# Patient Record
Sex: Male | Born: 1960
Health system: Southern US, Community
[De-identification: ages and names within clinical notes are randomized; demographics above are authoritative.]

## PROBLEM LIST (undated history)

## (undated) DIAGNOSIS — I10 Essential (primary) hypertension: Secondary | ICD-10-CM

## (undated) HISTORY — PX: NO PAST SURGERIES: SHX2092

---

## 2006-07-17 ENCOUNTER — Emergency Department: Payer: Self-pay | Admitting: Unknown Physician Specialty

## 2009-04-13 ENCOUNTER — Emergency Department: Payer: Self-pay | Admitting: Emergency Medicine

## 2016-04-09 ENCOUNTER — Encounter: Payer: Self-pay | Admitting: Emergency Medicine

## 2016-04-09 ENCOUNTER — Ambulatory Visit
Admission: EM | Admit: 2016-04-09 | Discharge: 2016-04-09 | Disposition: A | Discharge status: Home / Self Care | Payer: 59 | Attending: Internal Medicine | Admitting: Internal Medicine

## 2016-04-09 DIAGNOSIS — W57XXXA Bitten or stung by nonvenomous insect and other nonvenomous arthropods, initial encounter: Secondary | ICD-10-CM

## 2016-04-09 DIAGNOSIS — T148 Other injury of unspecified body region: Secondary | ICD-10-CM | POA: Diagnosis not present

## 2016-04-09 HISTORY — DX: Essential (primary) hypertension: I10

## 2016-04-09 MED ORDER — MUPIROCIN 2 % EX OINT: 1.0000 "application " | TOPICAL_OINTMENT | Freq: Three times a day (TID) | CUTANEOUS | Status: DC

## 2016-04-09 NOTE — ED Notes (Signed)
Patient reports having tick bites about a month ago.  Patient reports red knot on his right thigh that has not gone away.  Patient denies any joint pain, rash, or HAs.  Patient denies fevers.

## 2016-04-09 NOTE — Discharge Instructions (Signed)
Current tick bites are reported to be about a month old--with no rash, fever or increased joint discomfort noted. The area of inflammation is very small and appears to be resolving . I recommend using a Benadryl Itch Stik or other BugWand available OTC at the pharmacy. Using Cetirizine 10 mg (Zyrtec) 1 twice daily for 3 days may be helpful as well, though the acute stage appears to be over. 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 Please return to your PCP or to see [place] if you experience additional difficulties in the future  Antibiotic ointment has been ordered for your use in the future for outside injuries with skin infection  Tick Bite Information Ticks are insects that attach themselves to the skin and draw blood for food. There are various types of ticks. Common types include wood ticks and deer ticks. Most ticks live in shrubs and grassy areas. Ticks can climb onto your body when you make contact with leaves or grass where the tick is waiting. The most common places on the body for ticks to attach themselves are the scalp, neck, armpits, waist, and groin. Most tick bites are harmless, but sometimes ticks carry germs that cause diseases. These germs can be spread to a person during the tick's feeding process. The chance of a disease spreading through a tick bite depends on:   The type of tick.  Time of year.   How long the tick is attached.   Geographic location.  HOW CAN YOU PREVENT TICK BITES? Take these steps to help prevent tick bites when you are outdoors:  Wear protective clothing. Long sleeves and long pants are best.   Wear white clothes so you can see ticks more easily.  Tuck your pant legs into your socks.   If walking on a trail, stay in the middle of the trail to avoid brushing against bushes.  Avoid  walking through areas with long grass.  Put insect repellent on all exposed skin and along boot tops, pant legs, and sleeve cuffs.   Check clothing, hair, and skin repeatedly and before going inside.   Brush off any ticks that are not attached.  Take a shower or bath as soon as possible after being outdoors.  WHAT IS THE PROPER WAY TO REMOVE A TICK? Ticks should be removed as soon as possible to help prevent diseases caused by tick bites. 1. If latex gloves are available, put them on before trying to remove a tick.  2. Using fine-point tweezers, grasp the tick as close to the skin as possible. You may also use curved forceps or a tick removal tool. Grasp the tick as close to its head as possible. Avoid grasping the tick on its body. 3. Pull gently with steady upward pressure until the tick lets go. Do not twist the tick or jerk it suddenly. This may break off the tick's head or mouth parts. 4. Do not squeeze or crush the tick's body. This could force disease-carrying fluids from the tick into your body.  5. After the tick is removed, wash the bite area and your hands with soap and water or other disinfectant such as alcohol. 6. Apply a small amount of antiseptic cream or ointment to the bite site.  7. Wash and disinfect any instruments that were used.  Do not try to remove  a tick by applying a hot match, petroleum jelly, or fingernail polish to the tick. These methods do not work and may increase the chances of disease being spread from the tick bite.  WHEN SHOULD YOU SEEK MEDICAL CARE? Contact your health care provider if you are unable to remove a tick from your skin or if a part of the tick breaks off and is stuck in the skin.  After a tick bite, you need to be aware of signs and symptoms that could be related to diseases spread by ticks. Contact your health care provider if you develop any of the following in the days or weeks after the tick bite:  Unexplained fever.  Rash. A  circular rash that appears days or weeks after the tick bite may indicate the possibility of Lyme disease. The rash may resemble a target with a bull's-eye and may occur at a different part of your body than the tick bite.  Redness and swelling in the area of the tick bite.   Tender, swollen lymph glands.   Diarrhea.   Weight loss.   Cough.   Fatigue.   Muscle, joint, or bone pain.   Abdominal pain.   Headache.   Lethargy or a change in your level of consciousness.  Difficulty walking or moving your legs.   Numbness in the legs.   Paralysis.  Shortness of breath.   Confusion.   Repeated vomiting.    This information is not intended to replace advice given to you by your health care provider. Make sure you discuss any questions you have with your health care provider.   Document Released: 10/29/2000 Document Revised: 11/22/2014 Document Reviewed: 04/11/2013 Elsevier Interactive Patient Education 2016 [employer] Inc[employer]  DEET Insect Repellent  DEET is a commonly used insect repellent. DEET is effective against mosquitoes, ticks, and chiggers.DEET is not effective against stinging insects, such as bees and wasps. When mosquitoes or ticks are active, take the following precautions.  Use DEET according to the directions on the label.  Wear protective clothing if you are outside in an area where there are weeds, tall grass, or bushes. This includes long pants, socks, and loose-fitting, long-sleeved shirts. Consider spraying DEET on your clothing. Avoid being outdoors in the early evening. This is when mosquitoes are most active.  Products with a low concentration of DEET (10% to 20%) may be useful in areas with few insects. Higher concentrations of DEET may be needed in areas with many insects. Repellents used on children should not contain more than 30% DEET. Although higher concentrations of DEET (up to 95%) are available for adults, they are not recommended for  routine use. Concentrations higher than 50% do not provide additional protection. Depending on the concentration of DEET in a product, it can be effective for about 2 to 6 hours.  When applying DEET to children, use the lowest concentration that is effective. Ten percent DEET will last approximately 2 to 3 hours, while 30% will last 4 to 5 hours. Do not use DEET on infants younger than 2 months old. Do not apply DEET more often than once a day to children under the age of 2.  Avoid prolonged or excessive use of DEET. Use it sparingly to cover exposed skin and clothing. Adverse reactions to DEET in the recommended concentrations are uncommon. However, skin irritation can occur in some people.  Wash all treated skin and clothing with soap and water after returning indoors.  Do not allow children to apply insect  repellent themselves.  Do not apply DEET near cuts or open wounds. You can apply DEET and sunscreen together. However, it is recommend that you apply the sunscreen first.  Do not apply DEET to a child's hands or near a child's eyes and mouth. If DEET is accidentally sprayed in the eyes, wash the eyes out with large amounts of water.  Store DEET out of the reach of children.  Most authorities feel that it is safe to use DEET during pregnancy. However, pregnant women should only use insect repellents when they are in areas with a high risk of disease carried by insects (malaria, West Nile virus, encephalitis).   This information is not intended to replace advice given to you by your health care provider. Make sure you discuss any questions you have with your health care provider.   Document Released: 07/27/2001 Document Revised: 11/22/2014 Document Reviewed: 06/05/2015 Elsevier Interactive Patient Education [employer]

## 2016-04-09 NOTE — ED Provider Notes (Signed)
CSN: [id number]     Arrival date & time 04/09/16  1324 History   None    Chief Complaint  Patient presents with  . 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 Noted a tick on the right medial thigh and another on the left waist band about a month ago Removed both with tweezers without difficulty. Denies any interim fevers or malaise, no rashes or joint pain greater than his usual aches and discomfort of fatigue, very active..  Has some lingering itching at bite sites and became concerned .No idea how long the ticks were attached before removal. Reports BP hx  Past Medical History  Diagnosis Date  . Hypertension    History reviewed. No pertinent past surgical history. History reviewed. No pertinent family history. Social History  Substance Use Topics  . Smoking status: Never Smoker   . Smokeless tobacco: None  . Alcohol Use: Yes  3 glasses of wine per evening,more on weekends  Review of Systems Review of 10 systems negative for acute change except as referenced in HPI   Allergies  Review of patient's allergies indicates no known allergies.  Home Medications   Prior to Admission medications   Medication Sig Start Date End Date Taking? Authorizing Provider  Olmesartan-Amlodipine-HCTZ (TRIBENZOR) 20-5-12.5 MG TABS Take 1 tablet by mouth daily.   Yes Historical Provider, MD  mupirocin ointment (BACTROBAN) 2 % Apply 1 application topically 3 (three) times daily. 04/09/16   Rae HalstedLaurie W Lee, PA-C   Meds Ordered and Administered this Visit  Medications - No data to display  BP 150/73 mmHg  Pulse 96  Temp(Src) 98.2 F (36.8 C) (Tympanic)  Resp 16  Ht 5\' 7"  (1.702 m)  Wt 175 lb (79.379 kg)  BMI 27.40 kg/m2  SpO2 98% No data found.   Physical Exam   General: NAD, does not appear toxic HEENT:normocephalic,atraumatic, mucous membranes  moist,grossly normal hearing Eyes: EOMI, conjunctiva clear, conjugate gaze Neck: supple,no lymphadenopathy Resp : CT A, bilat; normal respiratory effort Card : RRR Abd:  Not distended Skin: very small firm nodule medial right thigh indicated as area of concern, resolving, faintly erythematous base;  reported, bite of waistband is difficult to identify other than mild skin surface irregularity which may be related to elastic wasitband on undershorts, or resolving lesion MSK: no deformities, ambulatory without assistance Neuro : good attention,recall-good memory, no focal neuro deficits Psych: speech and behavior appropriate Lymph- no inguinal  lymphadenoapthy noted  ED Course  Procedures (including critical care time)  Labs Review Labs Reviewed - No data to display  Imaging Review No results found.  Discussed tick bite prevention for future , recommendations to address symptoms only for current findings.  He is on the outer margin of window of awareness after tick bite and has apparently  had no symtoms of concern Treat to relieve itch. Protect from and repel insects in the future  Follow up with PCP for BP re-check  MDM   1. Tick bite    Plan: Diagnosis reviewed with patient  Rx irritation only prn  as per orders; Benadryl itch stick or BugWand.  Zyrtec po can also be used for 2-3 days. Benefits, risks, potential side effects reviewed   Recommend supportive treatment with cyclic tylenol and ibuprofen prn Seek additional medical care if symptoms worsen or are not improving     Rae HalstedLaurie W Lee, PA-C 04/13/16 2226

## 2018-03-29 ENCOUNTER — Encounter: Payer: Self-pay | Admitting: Emergency Medicine

## 2018-03-29 ENCOUNTER — Ambulatory Visit
Admission: EM | Admit: 2018-03-29 | Discharge: 2018-03-29 | Disposition: A | Discharge status: Home / Self Care | Payer: 59 | Attending: Family Medicine | Admitting: Family Medicine

## 2018-03-29 ENCOUNTER — Other Ambulatory Visit: Payer: Self-pay

## 2018-03-29 DIAGNOSIS — B9789 Other viral agents as the cause of diseases classified elsewhere: Secondary | ICD-10-CM

## 2018-03-29 DIAGNOSIS — A084 Viral intestinal infection, unspecified: Secondary | ICD-10-CM

## 2018-03-29 DIAGNOSIS — J988 Other specified respiratory disorders: Secondary | ICD-10-CM | POA: Diagnosis not present

## 2018-03-29 NOTE — ED Provider Notes (Signed)
[facility]    CSN: [id number] Arrival date & time: 03/29/18  1202     History   Chief Complaint Chief Complaint  Patient presents with  . Fever    HPI Carl Davidson is a 57 y.o. male.   57 yo male with a c/o fevers, chills, headache, sore throat, runny nose and diarrhea for the past 2 days. Denies any vomiting, abdominal pain, melena, hematochezia.   The history is provided by the patient.    Past Medical History:  Diagnosis Date  . Hypertension     There are no active problems to display for this patient.   History reviewed. No pertinent surgical history.     Home Medications    Prior to Admission medications   Medication Sig Start Date End Date Taking? Authorizing Provider  Olmesartan-Amlodipine-HCTZ (TRIBENZOR) 20-5-12.5 MG TABS Take 1 tablet by mouth daily.   Yes [provider]  Red Yeast Rice Extract 600 MG CAPS Take by mouth. 10/28/10  Yes [provider]  mupirocin ointment (BACTROBAN) 2 % Apply 1 application topically 3 (three) times daily. 04/09/16   Rae Halsted, PA-C    Family History Family History  Problem Relation Age of Onset  . Hypertension Mother   . Arthritis Mother   . Other Father        suicide    Social History Social History   Tobacco Use  . Smoking status: Former Smoker    Types: Cigars    Last attempt to quit: 03/30/2003    Years since quitting: 15.0  . Smokeless tobacco: Never Used  Substance Use Topics  . Alcohol use: Yes    Alcohol/week: 16.8 oz    Types: 14 Cans of beer, 14 Glasses of wine per week  . Drug use: Never     Allergies   Patient has no known allergies.   Review of Systems Review of Systems   Physical Exam Triage Vital Signs ED Triage Vitals [03/29/18 1217]  Enc Vitals Group     BP (!) 160/80     Pulse Rate 98     Resp 16     Temp 99.5 F (37.5 C)     Temp Source Oral     SpO2 100 %     Weight 165 lb (74.8 kg)     Height  (1.676 m)     Head  Circumference      Peak Flow      Pain Score 3     Pain Loc      Pain Edu?      Excl. in GC?    No data found.  Updated Vital Signs BP (!) 160/80 (BP Location: Left Arm)   Pulse 98   Temp 99.5 F (37.5 C) (Oral)   Resp 16   Ht  (1.676 m)   Wt 165 lb (74.8 kg)   SpO2 100%   BMI 26.63 kg/m   Visual Acuity Right Eye Distance:   Left Eye Distance:   Bilateral Distance:    Right Eye Near:   Left Eye Near:    Bilateral Near:     Physical Exam  Constitutional: He appears well-developed and well-nourished. No distress.  HENT:  Head: Normocephalic and atraumatic.  Right Ear: Tympanic membrane, external ear and ear canal normal.  Left Ear: Tympanic membrane, external ear and ear canal normal.  Nose: Nose normal.  Mouth/Throat: Uvula is midline, oropharynx is clear and moist and mucous membranes are normal.  No oropharyngeal exudate or tonsillar abscesses.  Eyes: Right eye exhibits no discharge. Left eye exhibits no discharge. No scleral icterus.  Neck: Normal range of motion. Neck supple. No tracheal deviation present. No thyromegaly present.  Cardiovascular: Normal rate, regular rhythm and normal heart sounds.  Pulmonary/Chest: Effort normal and breath sounds normal. No stridor. No respiratory distress. He has no wheezes. He has no rales. He exhibits no tenderness.  Abdominal: Soft. Bowel sounds are normal. He exhibits no distension and no mass. There is no tenderness. There is no rebound and no guarding. No hernia.  Lymphadenopathy:    He has no cervical adenopathy.  Neurological: He is alert.  Skin: Skin is warm and dry. No rash noted. He is not diaphoretic.  Nursing note and vitals reviewed.    UC Treatments / Results  Labs (all labs ordered are listed, but only abnormal results are displayed) Labs Reviewed - No data to display  EKG None  Radiology No results found.  Procedures Procedures (including critical care time)  Medications Ordered in  UC Medications - No data to display  Initial Impression / Assessment and Plan / UC Course  I have reviewed the triage vital signs and the nursing notes.  Pertinent labs & imaging results that were available during my care of the patient were reviewed by me and considered in my medical decision making (see chart for details).      Final Clinical Impressions(s) / UC Diagnoses   Final diagnoses:  Viral respiratory illness  Viral gastroenteritis     Discharge Instructions     Fluids then advance diet slowly as tolerated Ibuprofen or tylenol as needed Imodium AD     ED Prescriptions    None     1. Labs/x-ray results and diagnosis reviewed with patient 2. Recommend supportive treatment as above 3. Follow-up prn if symptoms worsen or don't improve  Controlled Substance Prescriptions [place] Controlled Substance Registry consulted? Not Applicable   Payton Mccallum, MD 03/29/18 1341

## 2018-03-29 NOTE — ED Triage Notes (Signed)
Patient in today c/o fever (101), chills, headache, sore throat, diarrhea x 1 day. Patient has been using OTC Tylenol.

## 2018-03-29 NOTE — Discharge Instructions (Signed)
Fluids then advance diet slowly as tolerated Ibuprofen or tylenol as needed Imodium AD

## 2019-06-10 ENCOUNTER — Other Ambulatory Visit: Payer: Self-pay

## 2019-06-10 ENCOUNTER — Ambulatory Visit (INDEPENDENT_AMBULATORY_CARE_PROVIDER_SITE_OTHER): Payer: 59

## 2019-06-10 ENCOUNTER — Ambulatory Visit
Admission: EM | Admit: 2019-06-10 | Discharge: 2019-06-10 | Disposition: A | Discharge status: Home / Self Care | Payer: 59 | Attending: Family Medicine | Admitting: Family Medicine

## 2019-06-10 DIAGNOSIS — W010XXA Fall on same level from slipping, tripping and stumbling without subsequent striking against object, initial encounter: Secondary | ICD-10-CM

## 2019-06-10 DIAGNOSIS — S2231XA Fracture of one rib, right side, initial encounter for closed fracture: Secondary | ICD-10-CM

## 2019-06-10 DIAGNOSIS — R0789 Other chest pain: Secondary | ICD-10-CM

## 2019-06-10 DIAGNOSIS — Y93K1 Activity, walking an animal: Secondary | ICD-10-CM | POA: Diagnosis not present

## 2019-06-10 NOTE — ED Triage Notes (Signed)
Patient complains of right sided rib pain that occurred from a fall when his dog pulled him down around 2:30pm today. Patient states that he heard a crack like noise from his ribs.

## 2019-06-10 NOTE — ED Provider Notes (Signed)
[facility] ____________________________________________  Time seen: Approximately 4:22 PM  I have reviewed the triage vital signs and the nursing notes.   HISTORY  Chief Complaint Fall and Chest Pain   HPI Carl Davidson is a 58 y.o. male presenting for evaluation of right rib pain after injury that occurred approximately hour ago.  Patient states he was outside walking his sons dog.  States the dog is a lab and is pretty strong.  States he was using a leash that he was stepping on intermittently when the dog was trying to pull away to chase deer.  States the dog with him pulled harder taking his foot out from under him causing him to fall directly on his right ribs.  Denies any chest discomfort prior to the fall.  States pain is all in the right side of his ribs in particular hurts with palpation, lots of movement or deep breaths.  Denies any other chest pain.  No shortness of breath.  Denies head injury or loss of consciousness.  Denies extremity pain.  Did take some Advil prior to arrival which is helped some.  No other alleviating measures taken, denies other aggravating factors.  No recent cough, fevers, sickness.  Aundria Mems, MD: PCP   Past Medical History:  Diagnosis Date  . Hypertension     There are no active problems to display for this patient.   Past Surgical History:  Procedure Laterality Date  . NO PAST SURGERIES       No current facility-administered medications for this encounter.   Current Outpatient Medications:  .  Olmesartan-Amlodipine-HCTZ (TRIBENZOR) 20-5-12.5 MG TABS, Take 1 tablet by mouth daily., Disp: , Rfl:  .  Red Yeast Rice Extract 600 MG CAPS, Take by mouth., Disp: , Rfl:   Allergies Patient has no known allergies.  Family History  Problem Relation Age of Onset  . Hypertension Mother   . Arthritis Mother   . Other Father        suicide    Social History Social History   Tobacco Use  . Smoking status:  Former Smoker    Types: Cigars    Quit date: 03/30/2003    Years since quitting: 16.2  . Smokeless tobacco: Never Used  Substance Use Topics  . Alcohol use: Yes    Alcohol/week: 28.0 standard drinks    Types: 14 Glasses of wine, 14 Cans of beer per week  . Drug use: Never    Review of Systems Constitutional: No fever Eyes: No visual changes. ENT: No sore throat. Cardiovascular: Denies chest pain. Respiratory: Denies shortness of breath. Gastrointestinal: No abdominal pain.  No nausea, no vomiting. Genitourinary: Negative for dysuria. Musculoskeletal: Positive right rib pain.  Negative for neck or back pain. Skin: Negative for rash. Neurological: Negative for focal weakness or numbness.  ____________________________________________   PHYSICAL EXAM:  VITAL SIGNS: ED Triage Vitals  Enc Vitals Group     BP 06/10/19 1551 (!) 144/76     Pulse Rate 06/10/19 1551 90     Resp 06/10/19 1551 18     Temp 06/10/19 1551 98.6 F (37 C)     Temp Source 06/10/19 1551 Oral     SpO2 06/10/19 1551 100 %     Weight 06/10/19 1550 175 lb (79.4 kg)     Height 06/10/19 1550 5\' 7"  (1.702 m)     Head Circumference --      Peak Flow --      Pain Score 06/10/19 1549  4     Pain Loc --      Pain Edu? --      Excl. in GC? --     Constitutional: Alert and oriented. Well appearing and in no acute distress. Eyes: Conjunctivae are normal.  ENT      Head: Normocephalic and atraumatic. Cardiovascular: Normal rate, regular rhythm. Grossly normal heart sounds.  Good peripheral circulation. Respiratory: Normal respiratory effort without tachypnea nor retractions. Breath sounds are clear and equal bilaterally. No wheezes, rales, rhonchi. Gastrointestinal: Soft and nontender. No distention.. Musculoskeletal:   No midline cervical, thoracic or lumbar tenderness to palpation. Except: Right mid rib lateral to anterior midclavicular line along sixth through ninth ribs tenderness to direct palpation mildly,  no ecchymosis no palpable rib fracture, chest otherwise nontender.  Right rib pain fully reproducible by direct palpation per patient. Neurologic:  Normal speech and language. No gross focal neurologic deficits are appreciated. Speech is normal. No gait instability.  Skin:  Skin is warm, dry and intact. No rash noted. Psychiatric: Mood and affect are normal. Speech and behavior are normal. Patient exhibits appropriate insight and judgment   ___________________________________________   LABS (all labs ordered are listed, but only abnormal results are displayed)  Labs Reviewed - No data to display ____________________________________________  RADIOLOGY  Dg Ribs Unilateral W/chest Right  Result Date: 06/10/2019 CLINICAL DATA:  Right rib pain secondary to a fall today. EXAM: RIGHT RIBS AND CHEST - 3+ VIEW COMPARISON:  None. FINDINGS: There is subtle cortical irregularity at the costochondral junction of the left seventh rib anteriorly which I suspect represents a nondisplaced hairline fracture. The lungs are clear. No pneumothorax or contusion or pleural effusion. Heart size and vascularity are normal. IMPRESSION: Probable hairline fracture at the costochondral junction of the right seventh rib. Electronically Signed   By: Francene BoyersJames  Maxwell M.D.   On: 06/10/2019 16:23   ____________________________________________   PROCEDURES Procedures   INITIAL IMPRESSION / ASSESSMENT AND PLAN / ED COURSE  Pertinent labs & imaging results that were available during my care of the patient were reviewed by me and considered in my medical decision making (see chart for details).  Well-appearing patient.  Right rib pain post fall on right ribs just prior to arrival due to dog pulling him.  Pain is mostly with activity and deep breaths.  No chest discomfort prior to fall.  X-rays as above per radiologist, hairline fracture at the costochondral junction of the right seventh rib.  Patient declined need for pain  medication.  Recommend over-the-counter Tylenol or ibuprofen as needed.  Ice.  Discussed frequent deep breath exercises throughout the day and monitoring.  Avoidance of heavy strenuous activity, particular overhead lifting.  Discussed follow up with Primary care physician next week. Discussed follow up and return parameters including no resolution or any worsening concerns. Patient verbalized understanding and agreed to plan.   ____________________________________________   FINAL CLINICAL IMPRESSION(S) / ED DIAGNOSES  Final diagnoses:  Closed fracture of one rib of right side, initial encounter     ED Discharge Orders    None       Note: This dictation was prepared with Dragon dictation along with smaller phrase technology. Any transcriptional errors that result from this process are unintentional.         Renford DillsMiller, [place] Nase, NP 06/10/19 1647

## 2019-06-10 NOTE — Discharge Instructions (Addendum)
Tylenol and ibuprofen as needed. Ice. Frequent deep breaths throughout the day. Rest. Drink plenty of fluids.   Follow up with your primary care physician next week. Return to Urgent care for new or worsening concerns.

## 2020-01-01 ENCOUNTER — Ambulatory Visit
Admission: EM | Admit: 2020-01-01 | Discharge: 2020-01-01 | Disposition: A | Discharge status: Home / Self Care | Payer: 59 | Attending: Family Medicine | Admitting: Family Medicine

## 2020-01-01 ENCOUNTER — Encounter: Payer: Self-pay | Admitting: Emergency Medicine

## 2020-01-01 ENCOUNTER — Other Ambulatory Visit: Payer: Self-pay

## 2020-01-01 ENCOUNTER — Ambulatory Visit (INDEPENDENT_AMBULATORY_CARE_PROVIDER_SITE_OTHER): Payer: 59

## 2020-01-01 DIAGNOSIS — R0789 Other chest pain: Secondary | ICD-10-CM | POA: Diagnosis not present

## 2020-01-01 DIAGNOSIS — R062 Wheezing: Secondary | ICD-10-CM | POA: Insufficient documentation

## 2020-01-01 DIAGNOSIS — R059 Cough, unspecified: Secondary | ICD-10-CM

## 2020-01-01 DIAGNOSIS — R05 Cough: Secondary | ICD-10-CM | POA: Diagnosis not present

## 2020-01-01 MED ORDER — PREDNISONE 50 MG PO TABS: ORAL_TABLET | ORAL | 0 refills | Status: AC

## 2020-01-01 MED ORDER — HYDROCODONE-HOMATROPINE 5-1.5 MG/5ML PO SYRP: 5.0000 mL | ORAL_SOLUTION | Freq: Four times a day (QID) | ORAL | 0 refills | Status: AC | PRN

## 2020-01-01 MED ORDER — ALBUTEROL SULFATE HFA 108 (90 BASE) MCG/ACT IN AERS: 1.0000 | INHALATION_SPRAY | Freq: Four times a day (QID) | RESPIRATORY_TRACT | 0 refills | Status: AC | PRN

## 2020-01-01 NOTE — Discharge Instructions (Signed)
Medications as directed.  Xray negative.  EKG looks good.  Take care  Dr. Adriana Simas

## 2020-01-01 NOTE — ED Provider Notes (Addendum)
[facility]    CSN: [id number] Arrival date & time: 01/01/20  1349      History   Chief Complaint Chief Complaint  Patient presents with  . Cough  . Chest Pain   HPI   59 year old male presents with the above complaints.  Patient has recently been treated for pneumonia on February 5.  Recently completed his antibiotic course.  States that he improved but then felt like he was worsening again.  He reports ongoing, cough chest tightness and wheezing.  He states that he was diagnosed clinically and had no x-ray.  Patient reports that he has had symptoms for approximately 5 weeks.  No known exacerbating factors.  He is continuing to take Atrovent nasal spray and Tessalon Perles for cough.  No other reported symptoms.  No other complaints.  Past Medical History:  Diagnosis Date  . Hypertension    Past Surgical History:  Procedure Laterality Date  . NO PAST SURGERIES     Home Medications    Prior to Admission medications   Medication Sig Start Date End Date Taking? Authorizing Provider  benzonatate (TESSALON) 200 MG capsule  01/01/20  Yes [provider]  ipratropium (ATROVENT) 0.06 % nasal spray Place into the nose. 12/21/19 12/20/20 Yes [provider]  Olmesartan-Amlodipine-HCTZ (TRIBENZOR) 20-5-12.5 MG TABS Take 1 tablet by mouth daily.   Yes [provider]  Olmesartan-amLODIPine-HCTZ 40-5-12.5 MG TABS Take by mouth. 02/10/15  Yes [provider]  Red Yeast Rice Extract 600 MG CAPS Take by mouth. 10/28/10  Yes [provider]  albuterol (VENTOLIN HFA) 108 (90 Base) MCG/ACT inhaler Inhale 1-2 puffs into the lungs every 6 (six) hours as needed for wheezing or shortness of breath. 01/01/20   Coral Spikes, DO  HYDROcodone-homatropine (HYCODAN) 5-1.5 MG/5ML syrup Take 5 mLs by mouth every 6 (six) hours as needed for cough. 01/01/20   Coral Spikes, DO  predniSONE (DELTASONE) 50 MG tablet 1 tablet daily x 5 days 01/01/20   Coral Spikes, DO    Family History Family History  Problem Relation Age of Onset  . Hypertension Mother   . Arthritis Mother   . Other Father        suicide    Social History Social History   Tobacco Use  . Smoking status: Former Smoker    Types: Cigars    Quit date: 03/30/2003    Years since quitting: 16.7  . Smokeless tobacco: Never Used  Substance Use Topics  . Alcohol use: Yes    Alcohol/week: 28.0 standard drinks    Types: 14 Glasses of wine, 14 Cans of beer per week  . Drug use: Never     Allergies   Patient has no known allergies.   Review of Systems Review of Systems  Constitutional: Negative for fever.  Respiratory: Positive for cough and chest tightness.    Physical Exam Triage Vital Signs ED Triage Vitals  Enc Vitals Group     BP 01/01/20 1403 (!) 149/96     Pulse Rate 01/01/20 1403 (!) 110     Resp 01/01/20 1403 18     Temp 01/01/20 1403 98.6 F (37 C)     Temp Source 01/01/20 1403 Oral     SpO2 01/01/20 1403 98 %     Weight 01/01/20 1401 175 lb (79.4 kg)     Height 01/01/20 1401 5\' 6"  (1.676 m)     Head Circumference --      Peak Flow --  Pain Score 01/01/20 1401 3     Pain Loc --      Pain Edu? --      Excl. in GC? --    Updated Vital Signs BP (!) 149/96 (BP Location: Right Arm)   Pulse (!) 110   Temp 98.6 F (37 C) (Oral)   Resp 18   Ht 5\' 6"  (1.676 m)   Wt 79.4 kg   SpO2 98%   BMI 28.25 kg/m   Visual Acuity Right Eye Distance:   Left Eye Distance:   Bilateral Distance:    Right Eye Near:   Left Eye Near:    Bilateral Near:     Physical Exam Vitals and nursing note reviewed.  Constitutional:      General: He is not in acute distress.    Appearance: Normal appearance. He is not ill-appearing.  HENT:     Head: Normocephalic and atraumatic.  Eyes:     General:        Right eye: No discharge.        Left eye: No discharge.     Conjunctiva/sclera: Conjunctivae normal.  Cardiovascular:     Rate and Rhythm: Regular rhythm.  Tachycardia present.  Pulmonary:     Effort: Pulmonary effort is normal.     Breath sounds: Wheezing present.  Neurological:     Mental Status: He is alert.  Psychiatric:        Mood and Affect: Mood normal.        Behavior: Behavior normal.    UC Treatments / Results  Labs (all labs ordered are listed, but only abnormal results are displayed) Labs Reviewed - No data to display  EKG Interpretation: Sinus tachycardia with a rate of 103.  Normal axis.  Incomplete right bundle branch block.  No ST or T wave changes.  Radiology DG Chest 2 View  Result Date: 01/01/2020 CLINICAL DATA:  Cough and chest pressure EXAM: CHEST - 2 VIEW COMPARISON:  June 10, 2019 FINDINGS: Lungs are clear. Heart size and pulmonary vascularity are normal. No adenopathy. No pneumothorax. No bone lesions. IMPRESSION: Lungs clear.  Cardiac silhouette within normal limits. Electronically Signed   By: June 12, 2019 III M.D.   On: 01/01/2020 14:30    Procedures Procedures (including critical care time)  Medications Ordered in UC Medications - No data to display  Initial Impression / Assessment and Plan / UC Course  I have reviewed the triage vital signs and the nursing notes.  Pertinent labs & imaging results that were available during my care of the patient were reviewed by me and considered in my medical decision making (see chart for details).    59 year old male presents with persistent cough and wheezing.  Chest x-ray negative.  EKG unremarkable/nonischemic.  Patient is a former smoker.  Albuterol and prednisone as directed.  Hycodan as needed for cough.  Supportive care.  Final Clinical Impressions(s) / UC Diagnoses   Final diagnoses:  Cough  Wheezing     Discharge Instructions     Medications as directed.  Xray negative.  EKG looks good.  Take care  Dr. 46    ED Prescriptions    Medication Sig Dispense Auth. Provider   albuterol (VENTOLIN HFA) 108 (90 Base) MCG/ACT inhaler  Inhale 1-2 puffs into the lungs every 6 (six) hours as needed for wheezing or shortness of breath. 18 g Shatara Stanek G, DO   predniSONE (DELTASONE) 50 MG tablet 1 tablet daily x 5 days 5 tablet Anton Ruiz, [place]  G, DO   HYDROcodone-homatropine (HYCODAN) 5-1.5 MG/5ML syrup Take 5 mLs by mouth every 6 (six) hours as needed for cough. 120 mL Tommie Sams, DO     PDMP not reviewed this encounter.   Tommie Sams, DO 01/01/20 1542    Everlene Other G, DO 01/01/20 1548

## 2020-01-01 NOTE — ED Triage Notes (Signed)
Patient was diagnosed with pneumonia on Feb 5th and was given antibiotics. He stated when he started the antibiotics the chest pain/pressure went away but when he finished the antibiotic on 2/11 the pain returned. He continues to still have a cough. He was negative for COVID on Feb 5th.

## 2020-02-18 ENCOUNTER — Ambulatory Visit: Payer: 59 | Attending: Internal Medicine

## 2020-02-18 ENCOUNTER — Other Ambulatory Visit: Payer: Self-pay

## 2020-02-18 DIAGNOSIS — Z23 Encounter for immunization: Secondary | ICD-10-CM

## 2020-02-18 NOTE — Progress Notes (Signed)
   Covid-19 Vaccination Clinic  Name:  Carl Davidson    MRN: [id number] DOB: 12-30-1960  02/18/2020  Carl Davidson was observed post Covid-19 immunization for 15 minutes without incident. He was provided with Vaccine Information Sheet and instruction to access the V-Safe system.   Carl Davidson was instructed to call 911 with any severe reactions post vaccine: Marland Kitchen Difficulty breathing  . Swelling of face and throat  . A fast heartbeat  . A bad rash all over body  . Dizziness and weakness   Immunizations Administered    Name Date Dose VIS Date Route   Pfizer COVID-19 Vaccine 02/18/2020  1:34 PM 0.3 mL 10/26/2019 Intramuscular   Manufacturer: [employer], [employer]   Lot: [id number]   NDC: [address]-6

## 2020-03-11 ENCOUNTER — Ambulatory Visit: Payer: 59 | Attending: Internal Medicine

## 2020-03-11 DIAGNOSIS — Z23 Encounter for immunization: Secondary | ICD-10-CM

## 2020-03-11 NOTE — Progress Notes (Signed)
   Covid-19 Vaccination Clinic  Name:  RUDDY SWIRE    MRN: [id number] DOB: 1961/04/12  03/11/2020  Mr. Neely was observed post Covid-19 immunization for 15 minutes without incident. He was provided with Vaccine Information Sheet and instruction to access the V-Safe system.   Mr. Unangst was instructed to call 911 with any severe reactions post vaccine: Marland Kitchen Difficulty breathing  . Swelling of face and throat  . A fast heartbeat  . A bad rash all over body  . Dizziness and weakness   Immunizations Administered    Name Date Dose VIS Date Route   Pfizer COVID-19 Vaccine 03/11/2020  3:10 PM 0.3 mL 01/09/2019 Intramuscular   Manufacturer: [employer], [employer]   Lot: [id number]   NDC: [address]-8

## 2020-06-16 IMAGING — CR DG CHEST 2V
2 series · 2 of 2 positions shown · non-contrast
Comparison: June 10, 2019

CLINICAL DATA: Cough and chest pressure

EXAM:
CHEST - 2 VIEW

[chest pa]
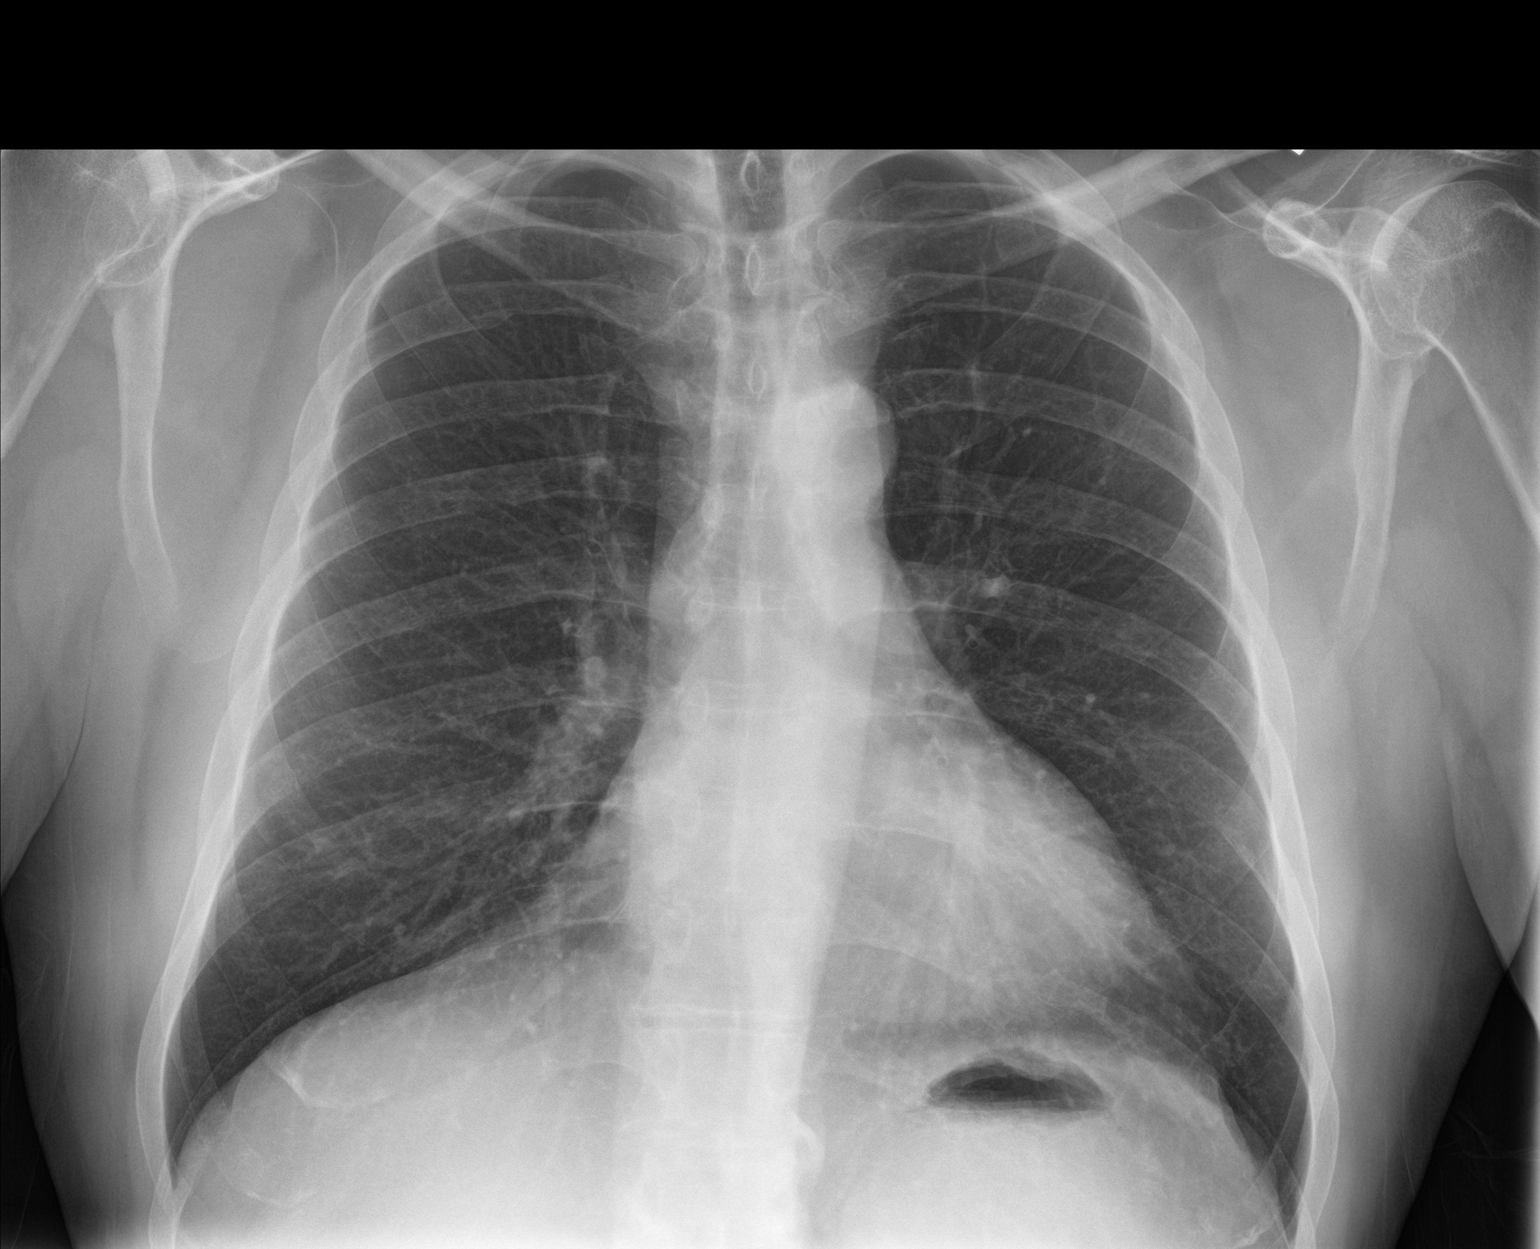

[chest lat]
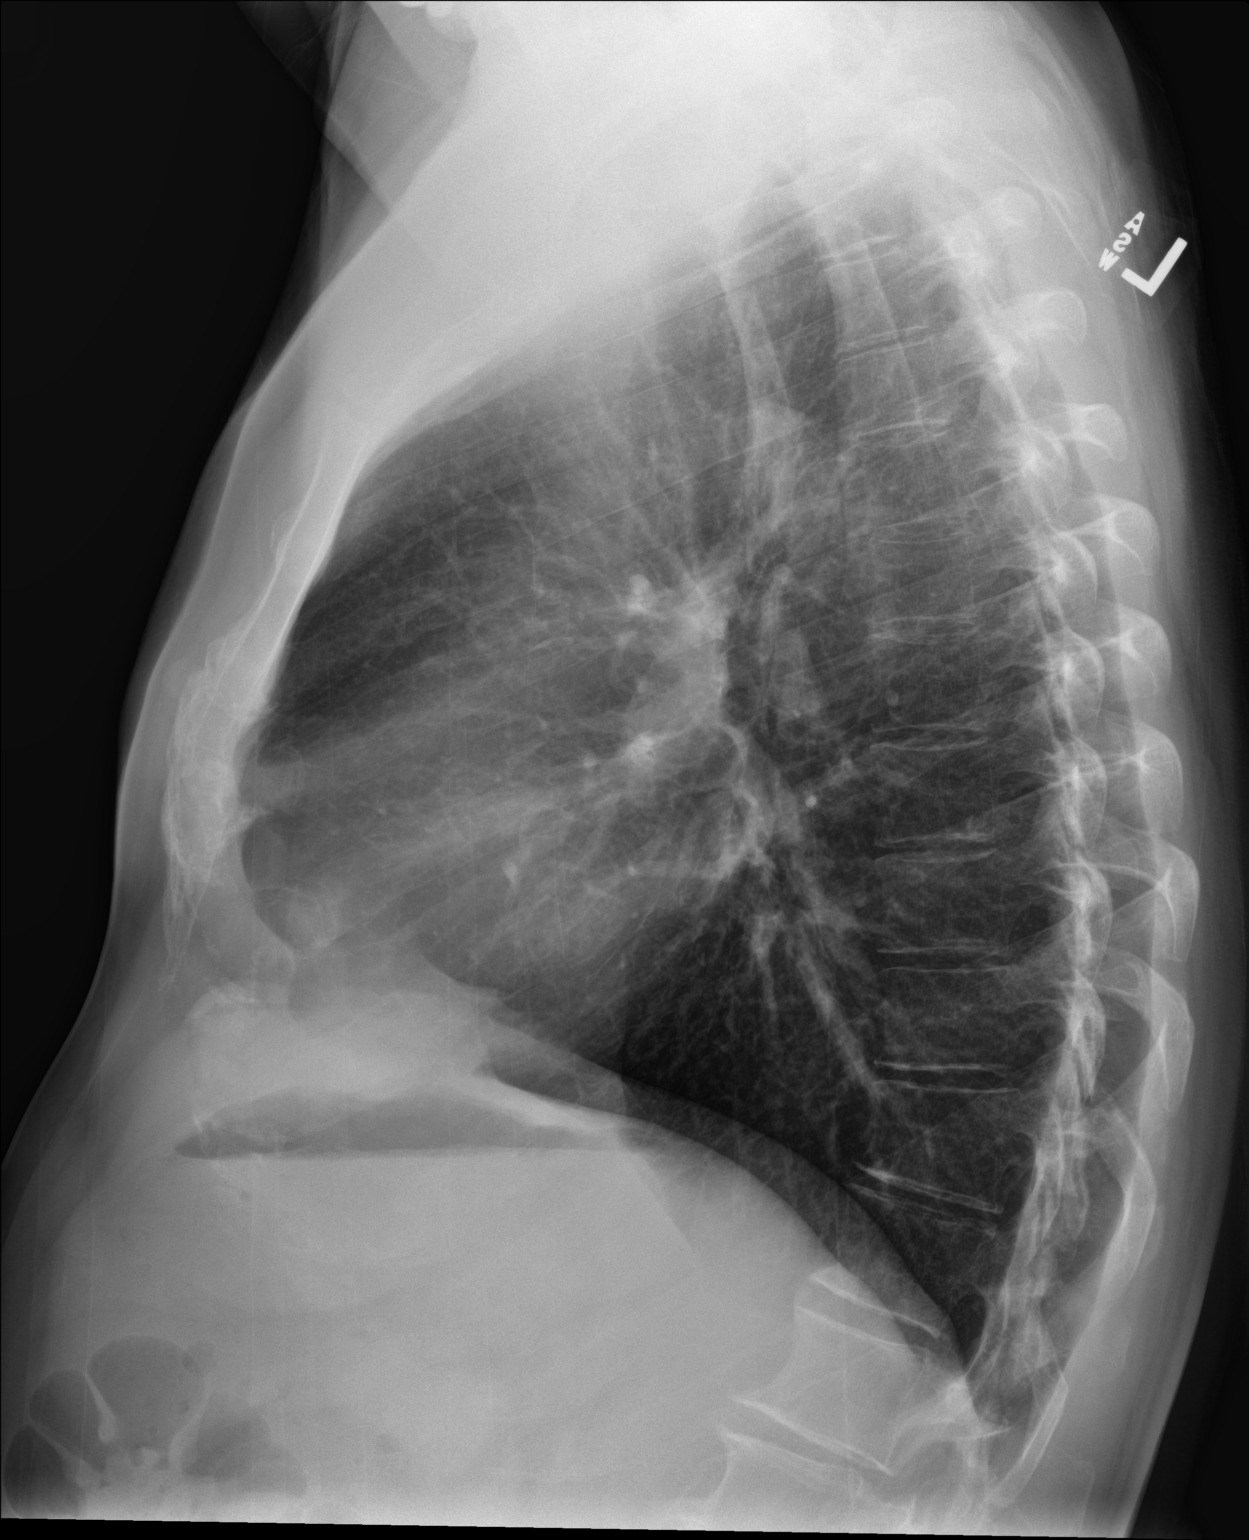

[2 of 2 positions shown; findings below may reference images not displayed]

FINDINGS: Lungs are clear. Heart size and pulmonary vascularity are normal. No
adenopathy. No pneumothorax. No bone lesions.
IMPRESSION: Lungs clear.  Cardiac silhouette within normal limits.
